# Patient Record
Sex: Male | Born: 1935 | Race: White | Hispanic: No | Marital: Married | Smoking: Former smoker
Health system: Southern US, Community
[De-identification: ages and names within clinical notes are randomized; demographics above are authoritative.]

---

## 2017-07-02 ENCOUNTER — Ambulatory Visit (INDEPENDENT_AMBULATORY_CARE_PROVIDER_SITE_OTHER): Payer: Medicare Other | Admitting: Sports Medicine

## 2017-07-02 ENCOUNTER — Ambulatory Visit (INDEPENDENT_AMBULATORY_CARE_PROVIDER_SITE_OTHER): Payer: Medicare Other

## 2017-07-02 ENCOUNTER — Encounter: Payer: Self-pay | Admitting: Sports Medicine

## 2017-07-02 DIAGNOSIS — G5762 Lesion of plantar nerve, left lower limb: Secondary | ICD-10-CM | POA: Diagnosis not present

## 2017-07-02 DIAGNOSIS — M19071 Primary osteoarthritis, right ankle and foot: Secondary | ICD-10-CM | POA: Diagnosis not present

## 2017-07-02 DIAGNOSIS — M65342 Trigger finger, left ring finger: Secondary | ICD-10-CM

## 2017-07-02 DIAGNOSIS — M19072 Primary osteoarthritis, left ankle and foot: Secondary | ICD-10-CM

## 2017-07-02 NOTE — Assessment & Plan Note (Addendum)
Injection as above, he did have an MRI back in 2015 that showed multiple degenerative processes, as well as what seemed to be an intra-articular loose body. Ankle joint injection as above will probably resolve most of his ankle pain but he did have some subtalar degenerative changes as well. Repeat x-rays, foot and ankle. Return to see me in one month. I did discuss with him at length the natural history of osteoarthritis, and the management protocol.

## 2017-07-02 NOTE — Assessment & Plan Note (Signed)
Injection a future visit if needed.

## 2017-07-02 NOTE — Assessment & Plan Note (Signed)
At patient's return we will discuss custom orthotics with neuroma pad.

## 2017-07-02 NOTE — Progress Notes (Signed)
Subjective:    I'm seeing this patient as a consultation for:  Dr. Carollee SiresMichael Garrison  CC: Left ankle, right foot pain  HPI: This is a pleasant 83 year old male, he is a fairly extensive lower extremity orthopedic history.  He has been to 2 or 3 podiatrist, as well as orthopedic foot and ankle surgery, he's had an MRI back in 2015 that showed ankle degenerative changes as well as midfoot degenerative changes, he's had an occasional injection here and there but never custom orthotics or physical therapy. Tells me he really doesn't have an actual diagnosis. On the left side his pain is localized over the tibiotalar joint with some mild swelling over the dorsal midfoot. On the right side his pain is predominantly at the first metatarsophalangeal joint.  He also complains of locking of his left middle finger consistent with a trigger finger as well as pain under the left tooth/3 and 3/4 interspace consistent with Morton's neuromas. He agrees to discuss these at future visits.  Past medical history, Surgical history, Family history not pertinant except as noted below, Social history, Allergies, and medications have been entered into the medical record, reviewed, and no changes needed.   Review of Systems: No headache, visual changes, nausea, vomiting, diarrhea, constipation, dizziness, abdominal pain, skin rash, fevers, chills, night sweats, weight loss, swollen lymph nodes, body aches, joint swelling, muscle aches, chest pain, shortness of breath, mood changes, visual or auditory hallucinations.   Objective:   General: Well Developed, well nourished, and in no acute distress.  Neuro:  Extra-ocular muscles intact, able to move all 4 extremities, sensation grossly intact.  Deep tendon reflexes tested were normal. Psych: Alert and oriented, mood congruent with affect. ENT:  Ears and nose appear unremarkable.  Hearing grossly normal. Neck: Unremarkable overall appearance, trachea midline.  No visible  thyroid enlargement. Eyes: Conjunctivae and lids appear unremarkable.  Pupils equal and round. Skin: Warm and dry, no rashes noted.  Cardiovascular: Pulses palpable, no extremity edema. Left Ankle: Swollen with tenderness around the talar dome Range of motion is full in all directions. Strength is 5/5 in all directions. Stable lateral and medial ligaments; squeeze test and kleiger test unremarkable; Talar dome nontender; No pain at base of 5th MT; No tenderness over cuboid; No tenderness over N spot or navicular prominence No tenderness on posterior aspects of lateral and medial malleolus No sign of peroneal tendon subluxations; Negative tarsal tunnel tinel's Able to walk 4 steps. Right Foot: No visible erythema or swelling. Range of motion is full in all directions. Strength is 5/5 in all directions. No hallux valgus. No pes cavus or pes planus. No abnormal callus noted. No pain over the navicular prominence, or base of fifth metatarsal. No tenderness to palpation of the calcaneal insertion of plantar fascia. No pain at the Achilles insertion. No pain over the calcaneal bursa. No pain of the retrocalcaneal bursa. Tender to palpation at first MTP No hallux rigidus or limitus. No tenderness palpation over interphalangeal joints. No pain with compression of the metatarsal heads. Neurovascularly intact distally.  Procedure: Real-time Ultrasound Guided Injection of left ankle joint Device: GE Logiq E  Verbal informed consent obtained.  Time-out conducted.  Noted no overlying erythema, induration, or other signs of local infection.  Skin prepped in a sterile fashion.  Local anesthesia: Topical Ethyl chloride.  With sterile technique and under real time ultrasound guidance:  25-gauge needle advanced into the ankle joint in care to avoid the dorsalis pedis artery, I injected 1 mL  kenalog 40, 1 mL lidocaine, 1 mL bupivacaine. Completed without difficulty  Pain immediately resolved  suggesting accurate placement of the medication.  Advised to call if fevers/chills, erythema, induration, drainage, or persistent bleeding.  Images permanently stored and available for review in the ultrasound unit.  Impression: Technically successful ultrasound guided injection.  Procedure: Real-time Ultrasound Guided Injection of right first MTP Device: GE Logiq E  Verbal informed consent obtained.  Time-out conducted.  Noted no overlying erythema, induration, or other signs of local infection.  Skin prepped in a sterile fashion.  Local anesthesia: Topical Ethyl chloride.  With sterile technique and under real time ultrasound guidance:  1/2 mL kenalog 40, 1/2 mL lidocaine injected easily Completed without difficulty  Pain immediately resolved suggesting accurate placement of the medication.  Advised to call if fevers/chills, erythema, induration, drainage, or persistent bleeding.  Images permanently stored and available for review in the ultrasound unit.  Impression: Technically successful ultrasound guided injection.  Impression and Recommendations:   This case required medical decision making of moderate complexity.  Osteoarthritis of first metatarsophalangeal (MTP) joint of right foot Injection as above, x-rays.  Return in one month.  We will probably build him some new custom orthotics at a future visit. He has been to two podiatrists and orthopedic foot and ankle surgery. He is on Eliquis so we cannot use NSAIDs.  Primary osteoarthritis of left ankle Injection as above, he did have an MRI back in 2015 that showed multiple degenerative processes, as well as what seemed to be an intra-articular loose body. Ankle joint injection as above will probably resolve most of his ankle pain but he did have some subtalar degenerative changes as well. Repeat x-rays, foot and ankle. Return to see me in one month. I did discuss with him at length the natural history of osteoarthritis, and  the management protocol.  Morton neuroma, left At patient's return we will discuss custom orthotics with neuroma pad.  Trigger finger, left ring finger Injection a future visit if needed.

## 2017-07-02 NOTE — Assessment & Plan Note (Signed)
Injection as above, x-rays.  Return in one month.  We will probably build him some new custom orthotics at a future visit. He has been to two podiatrists and orthopedic foot and ankle surgery. He is on Eliquis so we cannot use NSAIDs.

## 2017-07-03 ENCOUNTER — Encounter: Payer: Self-pay | Admitting: Sports Medicine

## 2017-07-30 ENCOUNTER — Ambulatory Visit (INDEPENDENT_AMBULATORY_CARE_PROVIDER_SITE_OTHER): Payer: Medicare Other | Admitting: Sports Medicine

## 2017-07-30 DIAGNOSIS — M19072 Primary osteoarthritis, left ankle and foot: Secondary | ICD-10-CM

## 2017-07-30 DIAGNOSIS — G5762 Lesion of plantar nerve, left lower limb: Secondary | ICD-10-CM | POA: Diagnosis not present

## 2017-07-30 DIAGNOSIS — M19071 Primary osteoarthritis, right ankle and foot: Secondary | ICD-10-CM

## 2017-07-30 NOTE — Assessment & Plan Note (Signed)
Left ankle pain is resolved after the last visit, he now complains of pain further down under his metatarsal heads. He does have multiple intra-articular loose bodies, so if persistent pain after orthotics we will send him for an ankle arthroscopy.

## 2017-07-30 NOTE — Progress Notes (Signed)
  Subjective:    CC: Follow-up  HPI: Right first MTP arthritis: Resolved after injection at the last visit.  Left ankle osteoarthritis: With intra-articular loose bodies, excellent response to injection at the last visit, only has a bit of discomfort now. He does have some discomfort over the dorsal midfoot which also has some degenerative changes.  Left foot Morton's neuroma/metatarsalgia: Has not yet returned for custom molded orthotics with metatarsal pad, the majority of pain is now referable to this.  Past medical history:  Negative.  See flowsheet/record as well for more information.  Surgical history: Negative.  See flowsheet/record as well for more information.  Family history: Negative.  See flowsheet/record as well for more information.  Social history: Negative.  See flowsheet/record as well for more information.  Allergies, and medications have been entered into the medical record, reviewed, and no changes needed.   Review of Systems: No fevers, chills, night sweats, weight loss, chest pain, or shortness of breath.   Objective:    General: Well Developed, well nourished, and in no acute distress.  Neuro: Alert and oriented x3, extra-ocular muscles intact, sensation grossly intact.  HEENT: Normocephalic, atraumatic, pupils equal round reactive to light, neck supple, no masses, no lymphadenopathy, thyroid nonpalpable.  Skin: Warm and dry, no rashes. Cardiac: Regular rate and rhythm, no murmurs rubs or gallops, no lower extremity edema.  Respiratory: Clear to auscultation bilaterally. Not using accessory muscles, speaking in full sentences. Left Ankle: No visible erythema or swelling. Range of motion is full in all directions. Strength is 5/5 in all directions. Stable lateral and medial ligaments; squeeze test and kleiger test unremarkable; No pain at base of 5th MT; No tenderness over cuboid; No tenderness over N spot or navicular prominence No tenderness on posterior  aspects of lateral and medial malleolus No sign of peroneal tendon subluxations; Negative tarsal tunnel tinel's Only minimal tenderness over the talar dome. The majority of his discomfort is at the plantar metatarsal heads.  Impression and Recommendations:    Morton neuroma, left Patient will return for custom orthotics. This is his main complaint now.  Osteoarthritis of first metatarsophalangeal (MTP) joint of right foot Right first MTP pain is resolved after injection at the last visit.  Primary osteoarthritis of left ankle Left ankle pain is resolved after the last visit, he now complains of pain further down under his metatarsal heads. He does have multiple intra-articular loose bodies, so if persistent pain after orthotics we will send him for an ankle arthroscopy.  I spent 25 minutes with this patient, greater than 50% was face-to-face time counseling regarding the above diagnoses ___________________________________________ Ihor Austinhomas J. Benjamin Stainhekkekandam, M.D., ABFM., CAQSM. Primary Care and Sports Medicine [facility]  Adjunct Instructor of Family Medicine  [employer]

## 2017-07-30 NOTE — Assessment & Plan Note (Signed)
Patient will return for custom orthotics. This is his main complaint now.

## 2017-07-30 NOTE — Assessment & Plan Note (Signed)
Right first MTP pain is resolved after injection at the last visit.

## 2017-07-31 ENCOUNTER — Encounter: Payer: Self-pay | Admitting: Sports Medicine

## 2017-08-04 ENCOUNTER — Ambulatory Visit (INDEPENDENT_AMBULATORY_CARE_PROVIDER_SITE_OTHER): Payer: Medicare Other | Admitting: Sports Medicine

## 2017-08-04 DIAGNOSIS — R200 Anesthesia of skin: Secondary | ICD-10-CM

## 2017-08-04 DIAGNOSIS — R202 Paresthesia of skin: Secondary | ICD-10-CM

## 2017-08-04 DIAGNOSIS — M19072 Primary osteoarthritis, left ankle and foot: Secondary | ICD-10-CM | POA: Diagnosis not present

## 2017-08-04 MED ORDER — GABAPENTIN 300 MG PO CAPS: 300.0000 mg | ORAL_CAPSULE | Freq: Every day | ORAL | 3 refills | Status: DC

## 2017-08-04 NOTE — Progress Notes (Signed)
    Patient was fitted for a : standard, cushioned, semi-rigid orthotic. The orthotic was heated and afterward the patient stood on the orthotic blank positioned on the orthotic stand. The patient was positioned in subtalar neutral position and 10 degrees of ankle dorsiflexion in a weight bearing stance. After completion of molding, a stable base was applied to the orthotic blank. The blank was ground to a stable position for weight bearing. Size: 11 Base: White EVA Additional Posting and Padding: None The patient ambulated these, and they were very comfortable.  I spent 40 minutes with this patient, greater than 50% was face-to-face time counseling regarding the below diagnosis.  ___________________________________________ Hiroshi J. Haden Suder, M.D., ABFM., CAQSM. Primary Care and Sports Medicine [facility]  Adjunct Instructor of Family Medicine  [employer]   

## 2017-08-04 NOTE — Addendum Note (Signed)
Addended by: Monica BectonHEKKEKANDAM, Amere J on: 08/04/2017 02:50 PM   Modules accepted: Orders

## 2017-08-04 NOTE — Assessment & Plan Note (Addendum)
Good the temporary response to ankle injection, there are multiple intra-articular loose bodies. Custom orthotics as above, I would like Dr. Lelon Mastaws to consider an ankle arthroscopy for removal of these loose bodies.  I think he would be a candidate for an ankle fusion/arthroplasty but he declines this procedure.

## 2017-08-04 NOTE — Assessment & Plan Note (Signed)
Predominantly nocturnal, this is likely from lumbar spinal stenosis. Adding 300 mg of gabapentin at bedtime, return in one month.

## 2017-08-04 NOTE — Addendum Note (Signed)
Addended by: Monica BectonHEKKEKANDAM, Daiel J on: 08/04/2017 02:43 PM   Modules accepted: Orders

## 2017-09-01 ENCOUNTER — Ambulatory Visit (INDEPENDENT_AMBULATORY_CARE_PROVIDER_SITE_OTHER): Payer: Medicare Other | Admitting: Sports Medicine

## 2017-09-01 ENCOUNTER — Encounter: Payer: Self-pay | Admitting: Sports Medicine

## 2017-09-01 DIAGNOSIS — R200 Anesthesia of skin: Secondary | ICD-10-CM

## 2017-09-01 DIAGNOSIS — M722 Plantar fascial fibromatosis: Secondary | ICD-10-CM | POA: Diagnosis not present

## 2017-09-01 DIAGNOSIS — M19072 Primary osteoarthritis, left ankle and foot: Secondary | ICD-10-CM | POA: Diagnosis not present

## 2017-09-01 DIAGNOSIS — R202 Paresthesia of skin: Principal | ICD-10-CM

## 2017-09-01 NOTE — Assessment & Plan Note (Addendum)
Modified the orthotic to some degree, we'll go try this for 2 weeks before considering plantar fascia injection.

## 2017-09-01 NOTE — Progress Notes (Signed)
  Subjective:    CC: Follow-up  HPI: Peter Rogers returns, he has in stage ankle osteoarthritis on the left, we did an injection, tried custom orthotics with minimal relief, he did follow-up with orthopedic foot and ankle surgery, they're recommendation was ankle fusion versus arthroplasty, he was not yet ready for this. He tells me he does have some increasing discomfort on the plantar aspect of his heel in the orthotics, moderate, persistent, worse with the first few steps in the morning and highly consistent with plantar fasciitis.  Past medical history:  Negative.  See flowsheet/record as well for more information.  Surgical history: Negative.  See flowsheet/record as well for more information.  Family history: Negative.  See flowsheet/record as well for more information.  Social history: Negative.  See flowsheet/record as well for more information.  Allergies, and medications have been entered into the medical record, reviewed, and no changes needed.   Review of Systems: No fevers, chills, night sweats, weight loss, chest pain, or shortness of breath.   Objective:    General: Well Developed, well nourished, and in no acute distress.  Neuro: Alert and oriented x3, extra-ocular muscles intact, sensation grossly intact.  HEENT: Normocephalic, atraumatic, pupils equal round reactive to light, neck supple, no masses, no lymphadenopathy, thyroid nonpalpable.  Skin: Warm and dry, no rashes. Cardiac: Regular rate and rhythm, no murmurs rubs or gallops, no lower extremity edema.  Respiratory: Clear to auscultation bilaterally. Not using accessory muscles, speaking in full sentences. Left Foot: No visible erythema or swelling. Range of motion is full in all directions. Strength is 5/5 in all directions. No hallux valgus. No pes cavus or pes planus. No abnormal callus noted. No pain over the navicular prominence, or base of fifth metatarsal. Moderate tenderness to palpation of the calcaneal  insertion of plantar fascia. No pain at the Achilles insertion. No pain over the calcaneal bursa. No pain of the retrocalcaneal bursa. No tenderness to palpation over the tarsals, metatarsals, or phalanges. No hallux rigidus or limitus. No tenderness palpation over interphalangeal joints. No pain with compression of the metatarsal heads. Neurovascularly intact distally.  Impression and Recommendations:    Primary osteoarthritis of left ankle Did discuss operative intervention with orthopedic surgery, arthroscopy was not offered, ankle replacement however was, he is not ready for this. Orthotics were created last time, I do think he will continue to have some pain and we will probably have to do occasional injections, ultimately he may need to do pain management.  ___________________________________________ Peter Rogers. Peter Rogers, M.D., ABFM., CAQSM. Primary Care and Sports Medicine [facility]  Adjunct Instructor of Family Medicine  [employer]

## 2017-09-01 NOTE — Assessment & Plan Note (Signed)
Did discuss operative intervention with orthopedic surgery, arthroscopy was not offered, ankle replacement however was, he is not ready for this. Orthotics were created last time, I do think he will continue to have some pain and we will probably have to do occasional injections, ultimately he may need to do pain management.

## 2017-09-02 MED ORDER — GABAPENTIN 300 MG PO CAPS: 300.0000 mg | ORAL_CAPSULE | Freq: Every day | ORAL | 1 refills | Status: AC

## 2017-09-15 ENCOUNTER — Encounter: Payer: Self-pay | Admitting: Sports Medicine

## 2017-09-15 ENCOUNTER — Ambulatory Visit (INDEPENDENT_AMBULATORY_CARE_PROVIDER_SITE_OTHER): Payer: Medicare Other | Admitting: Sports Medicine

## 2017-09-15 DIAGNOSIS — G5762 Lesion of plantar nerve, left lower limb: Secondary | ICD-10-CM | POA: Diagnosis not present

## 2017-09-15 DIAGNOSIS — M19072 Primary osteoarthritis, left ankle and foot: Secondary | ICD-10-CM | POA: Diagnosis not present

## 2017-09-15 DIAGNOSIS — M722 Plantar fascial fibromatosis: Secondary | ICD-10-CM | POA: Diagnosis not present

## 2017-09-15 NOTE — Assessment & Plan Note (Signed)
A large dose steroid injection by podiatry did provide some temporary relief, I told him if his Morton's neuroma pain gets out of hand I be happy to do a similar intermetatarsal Morton's neuroma type injection.

## 2017-09-15 NOTE — Progress Notes (Signed)
  Subjective:    CC: Follow-up  HPI: Peter Rogers returns, he has multiple pathologic processes in both feet including severe end-Maisie Fusstage left tibiotalar joint osteoarthritis, foot and ankle surgery did recommend ankle replacement, he was not interested.  He has plantar fasciitis not helped by night splinting, rehab exercises, custom orthotics.  He also has Morton's neuroma that was helped with an injection sometime ago.  At this point he understands that he may have to live with some degree of discomfort, he tells me when he is just sitting and relaxing he really has no pain.  Past medical history:  Negative.  See flowsheet/record as well for more information.  Surgical history: Negative.  See flowsheet/record as well for more information.  Family history: Negative.  See flowsheet/record as well for more information.  Social history: Negative.  See flowsheet/record as well for more information.  Allergies, and medications have been entered into the medical record, reviewed, and no changes needed.   Review of Systems: No fevers, chills, night sweats, weight loss, chest pain, or shortness of breath.   Objective:    General: Well Developed, well nourished, and in no acute distress.  Neuro: Alert and oriented x3, extra-ocular muscles intact, sensation grossly intact.  HEENT: Normocephalic, atraumatic, pupils equal round reactive to light, neck supple, no masses, no lymphadenopathy, thyroid nonpalpable.  Skin: Warm and dry, no rashes. Cardiac: Regular rate and rhythm, no murmurs rubs or gallops, no lower extremity edema.  Respiratory: Clear to auscultation bilaterally. Not using accessory muscles, speaking in full sentences.  Impression and Recommendations:    Plantar fasciitis, left Only with mild pain right now. Does not need an injection however he is going to try a [employer] Walk, if there is severe pain afterwards, he will ice the foot for 20 minutes, if still painful I can do an  injection.  Morton neuroma, left A large dose steroid injection by podiatry did provide some temporary relief, I told him if his Morton's neuroma pain gets out of hand I be happy to do a similar intermetatarsal Morton's neuroma type injection.  Primary osteoarthritis of left ankle Ankle replacement is really the only option, he is not interested in pursuing this, he can live with the discomfort for now. Happy to do occasional tibiotalar joint injections.  I spent 25 minutes with this patient, greater than 50% was face-to-face time counseling regarding the above diagnoses ___________________________________________ Ihor Austinhomas J. Benjamin Stainhekkekandam, M.D., ABFM., CAQSM. Primary Care and Sports Medicine [facility]  Adjunct Instructor of Family Medicine  [employer]

## 2017-09-15 NOTE — Assessment & Plan Note (Signed)
Ankle replacement is really the only option, he is not interested in pursuing this, he can live with the discomfort for now. Happy to do occasional tibiotalar joint injections.

## 2017-09-15 NOTE — Assessment & Plan Note (Signed)
Only with mild pain right now. Does not need an injection however he is going to try a Two [employer] Walk, if there is severe pain afterwards, he will ice the foot for 20 minutes, if still painful I can do an injection.

## 2017-09-22 ENCOUNTER — Encounter: Payer: Self-pay | Admitting: Sports Medicine

## 2018-04-10 IMAGING — DX DG FOOT COMPLETE 3+V*R*
3 series · 3 of 3 positions shown · non-contrast
Comparison: None.

CLINICAL DATA: Chronic bilateral foot pain.

EXAM:
RIGHT FOOT COMPLETE - 3+ VIEW

[foot ap]
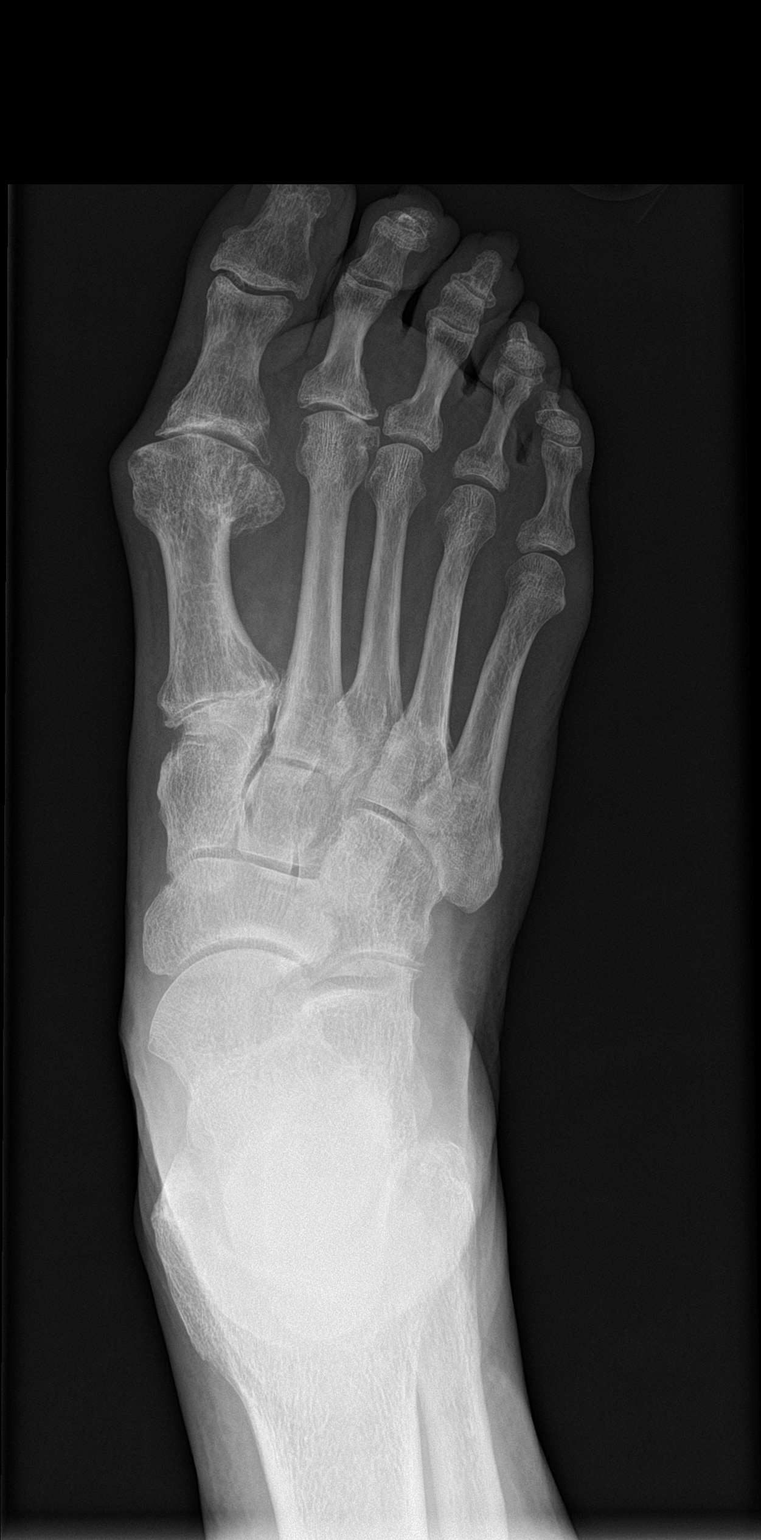

[foot obl]
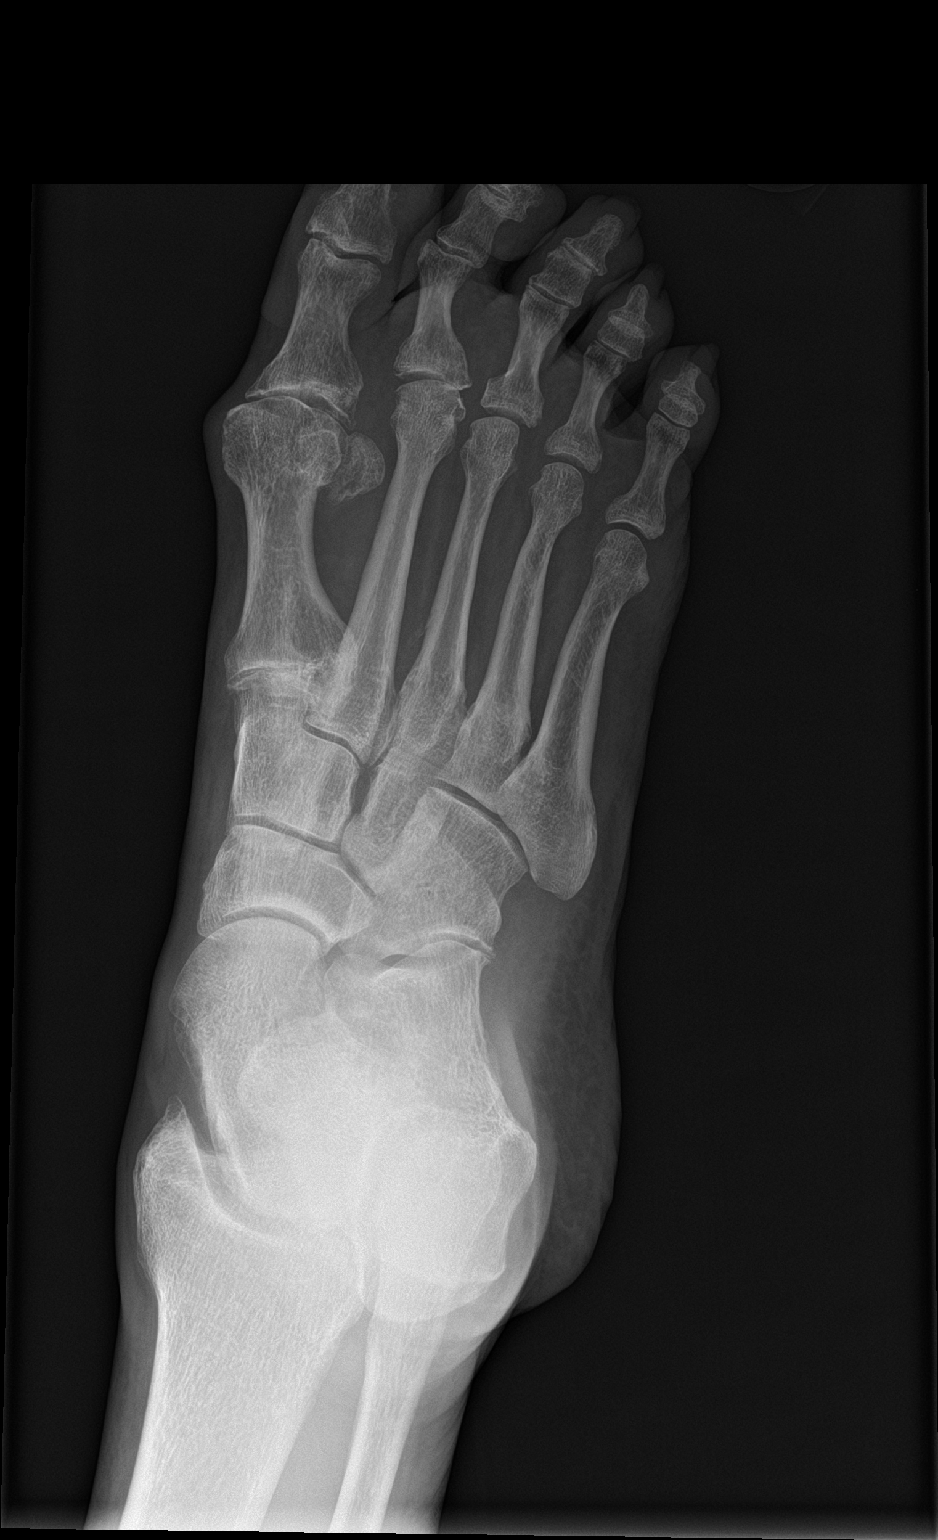

[foot lat]
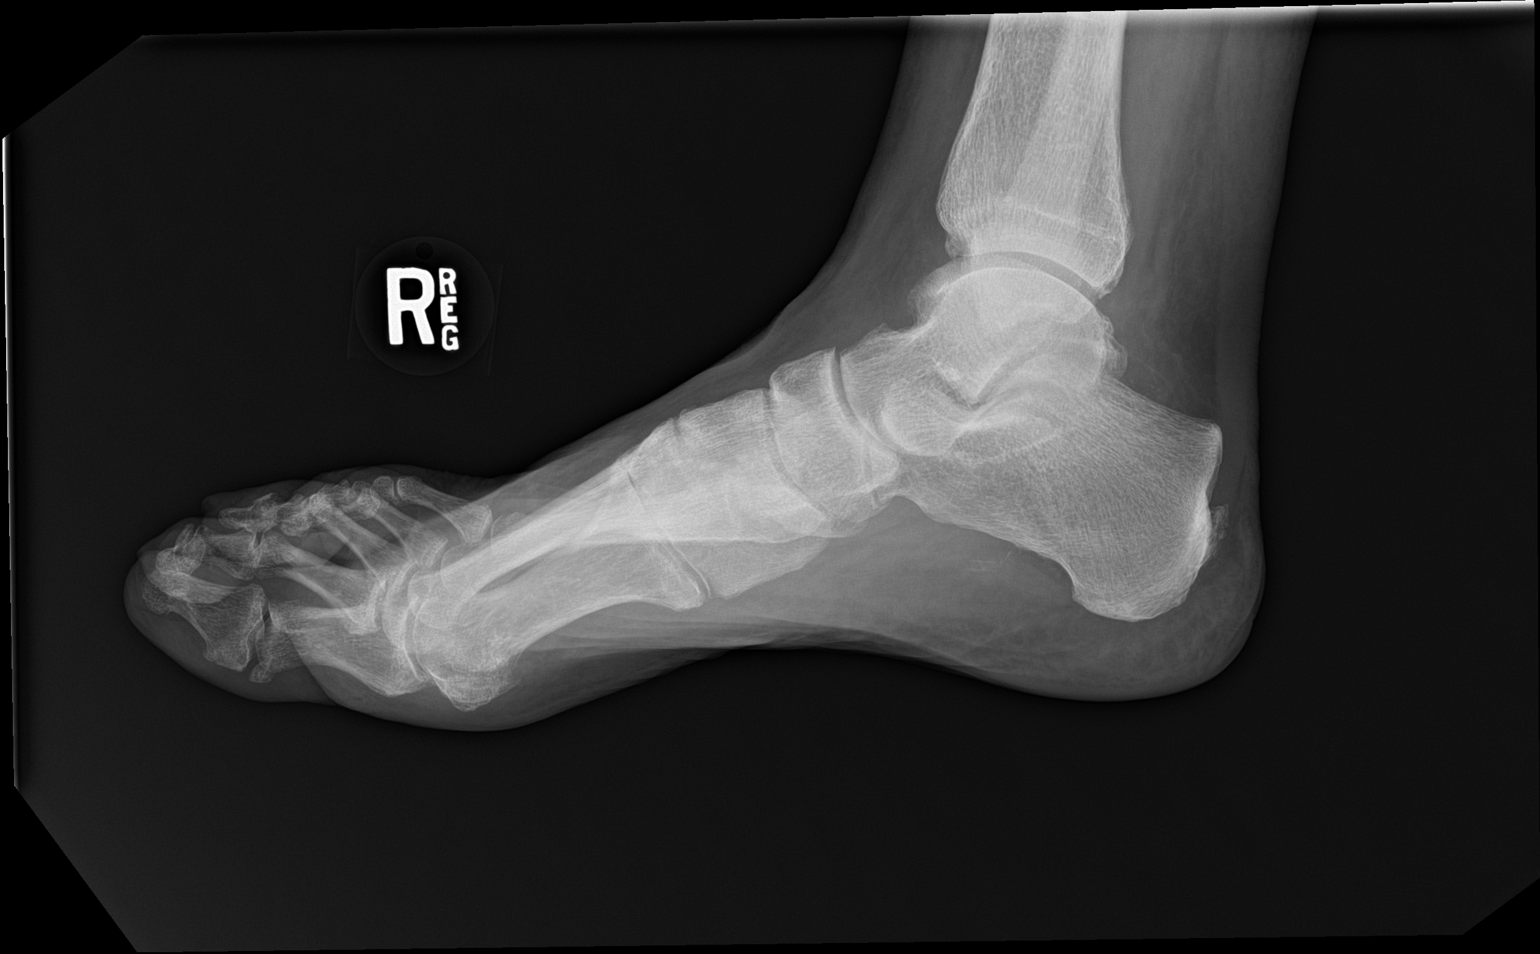

[3 of 3 positions shown; findings below may reference images not displayed]

FINDINGS: Moderate hallux valgus is seen. The patient has marked joint space
narrowing and osteophytosis about the first and second MTP joints.
Joint space narrowing and osteophytosis are also seen at the first
tarsometatarsal joint. Joint spaces are otherwise maintained. No
erosion or periostitis. No fracture or dislocation. Mild
calcification in the Achilles at its insertion on the calcaneus is
noted.
IMPRESSION: Severe appearing osteoarthritis first and second MTP joints. Mild to
moderate degenerative change first TMT joint also identified.

Hallux valgus.
